# Patient Record
Sex: Female | Born: 1955 | Race: White | Hispanic: No | Marital: Single | Smoking: Current every day smoker
Health system: Southern US, Community
[De-identification: ages and names within clinical notes are randomized; demographics above are authoritative.]

## PROBLEM LIST (undated history)

## (undated) DIAGNOSIS — N6009 Solitary cyst of unspecified breast: Secondary | ICD-10-CM

## (undated) DIAGNOSIS — K219 Gastro-esophageal reflux disease without esophagitis: Secondary | ICD-10-CM

## (undated) HISTORY — PX: BUNIONECTOMY: SHX129

## (undated) HISTORY — DX: Solitary cyst of unspecified breast: N60.09

## (undated) HISTORY — PX: APPENDECTOMY: SHX54

## (undated) HISTORY — DX: Gastro-esophageal reflux disease without esophagitis: K21.9

## (undated) HISTORY — PX: LAPAROSCOPY: SHX197

---

## 2006-09-11 ENCOUNTER — Ambulatory Visit: Payer: Self-pay | Admitting: Obstetrics & Gynecology

## 2006-09-11 ENCOUNTER — Encounter: Payer: Self-pay | Admitting: Obstetrics & Gynecology

## 2007-05-05 ENCOUNTER — Ambulatory Visit: Payer: Self-pay | Admitting: Obstetrics & Gynecology

## 2007-05-26 ENCOUNTER — Ambulatory Visit: Payer: Self-pay | Admitting: *Deleted

## 2008-08-17 ENCOUNTER — Ambulatory Visit: Payer: Self-pay | Admitting: Obstetrics & Gynecology

## 2008-08-17 ENCOUNTER — Encounter: Payer: Self-pay | Admitting: Obstetrics & Gynecology

## 2008-08-17 LAB — CONVERTED CEMR LAB
ALT: 24 units/L (ref 0–35)
Alkaline Phosphatase: 66 units/L (ref 39–117)
CO2: 23 meq/L (ref 19–32)
Cholesterol: 157 mg/dL (ref 0–200)
Creatinine, Ser: 0.7 mg/dL (ref 0.40–1.20)
Glucose, Bld: 94 mg/dL (ref 70–99)
HCT: 41.7 % (ref 36.0–46.0)
LDL Cholesterol: 76 mg/dL (ref 0–99)
MCHC: 33.1 g/dL (ref 30.0–36.0)
MCV: 94.8 fL (ref 78.0–100.0)
Platelets: 290 10*3/uL (ref 150–400)
Sodium: 138 meq/L (ref 135–145)
Total Bilirubin: 0.4 mg/dL (ref 0.3–1.2)
Total CHOL/HDL Ratio: 2.7
Total Protein: 6.7 g/dL (ref 6.0–8.3)
Triglycerides: 113 mg/dL (ref <150)
VLDL: 23 mg/dL (ref 0–40)
WBC: 7.3 10*3/uL (ref 4.0–10.5)

## 2009-09-21 ENCOUNTER — Ambulatory Visit: Payer: Self-pay | Admitting: Obstetrics & Gynecology

## 2011-01-09 NOTE — Assessment & Plan Note (Signed)
NAME:  Daisy Garcia, Daisy Garcia              ACCOUNT NO.:  [id number]   MEDICAL RECORD NO.:  [id number]          PATIENT TYPE:  POB   LOCATION:  [facility] [place]         FACILITY:  [facility]   PHYSICIAN:  Ugonna Duru, MD        DATE OF BIRTH:  04/16/1956   DATE OF SERVICE:                                  CLINIC NOTE   The patient comes to the office today for her yearly pelvic exam.  The  patient did have her last Pap smear, which was normal.  She also had a  mammogram last year which was normal and an ultrasound for some right  lower quadrant pain and that was also normal.  She had a DEXA scan done  as well which did show osteoporosis.  She is currently on Fosamax 35 mg  1 per week and has done well with that.  She is also exercising  regularly with a personal trainer.  She also has a history of psoriasis.  She did see a dermatologist for that and she is planning to return to  see that dermatologist.  Since her last office visit, she has not been  smoking.  She is currently smoking 1 package of cigarettes per day.  We  did discuss this.  She declines Chantix.  She is here for the side  effects of Chantix.  Her weight is also up today from last year.  She  was 163 and today she is 171.  She again does exercise and she feels  that she eats well on her cholesterol food.  She did start her menopause  in 2004.  She is not using any hormone replacement at this time.  She is  again for a full side effects.  She is currently not sexually active.   PAST MEDICAL HISTORY:  Endometriosis, osteoporosis, and menopause.   REVIEW OF SYSTEMS:  The patient complains of headache, vaginal dryness,  and psoriasis.   SOCIAL HISTORY:  The patient smokes one pack of cigarettes per day and  drinks alcohol socially.   ALLERGIES:  No known drug allergies.   PHYSICAL EXAMINATION:  GENERAL:  Well-developed and well-nourished 52-  year-old Caucasian female in no acute distress.  VITAL SIGNS:  Blood pressure is  95/69, pulse 82, weight 171, and height  is 5 feet 6.  HEENT:  Head is normocephalic and atraumatic.  CARDIAC:  Regular rate and rhythm.  LUNGS:  Clear bilaterally without rales, rhonchi, or wheezes.  BREASTS:  Breasts were without any mass or nipple discharge.  ABDOMEN:  Soft and round and with no mass.  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  Vaginal area is moderately atrophic.  Cervix is without lesions.  Uterus, normal size and shape.  Adnexa, not enlarged and nontender.   ASSESSMENT AND PLAN:  Annual gynecologic exam.  Pap smear was obtained.  The patient was quite uncomfortable and hopefully we did get all the  cells needed.  The patient is scheduled for mammogram as well as TSH,  CBC, Chem-20, and lipid panel.  She will also be scheduled for a  mammogram.  She will continue the  Fosamax 35   mg 1 every week.  She was given a prescription for 1 year.  The patient was asked to return to the office as needed.  She was also  asked to discontinue smoking.      Linda Rosenbloom, NP    ______________________________  Ugonna Duru, MD    LR/MEDQ  D:  08/17/2008  T:  08/18/2008  Job:  [id number] 

## 2011-01-09 NOTE — Assessment & Plan Note (Signed)
NAMELEATHER, ESTIS              ACCOUNT NO.:  [id number]   MEDICAL RECORD NO.:  [id number]          PATIENT TYPE:  POB   LOCATION:  [facility] at [facility]         FACILITY:  [facility]   PHYSICIAN:  Allie Bossier, MD        DATE OF BIRTH:  05-10-1956   DATE OF SERVICE:  09/21/2009                                  CLINIC NOTE   Ms. Hirsch is a 55 year old single white, gravida 0, who comes in here  for annual exam.  She has no particular complaints today.  She is  interested in smoking cessation but does not want Chantix.   PAST MEDICAL HISTORY:  Osteoporosis diagnosed on DEXA scan 2008, and she  is a smoker.   REVIEW OF SYSTEMS:  She is abstinent.  She works at [employer] in [employer].   PAST SURGICAL HISTORY:  She has had bunion/neuromas of her left foot.  She has had laparoscopy done for endometriosis in the distant past.   ALLERGIES:  No known drug allergies.  No LATEX allergies.   MEDICATIONS:  She takes alendronate 35 mg weekly, flaxseed oil,  glucosamine, vitamin C, vitamin B6, B12, and potassium.  She also takes  evening primrose oil.   SOCIAL HISTORY:  She smokes for decades.   FAMILY HISTORY:  Osteoporosis in her mother and grandmother.  Breast  cancer in her mother.  She denies family history of colon and GYN  cancer, but coronary artery disease is present in her family.   PHYSICAL EXAMINATION:  VITAL SIGNS:  Weight 174, height 5 feet 6 inches,  blood pressure 115/75, pulse 82.  HEENT:  Normal.  BREASTS:  Normal bilaterally.  ABDOMEN:  Benign.  She has a scar in her umbilicus from her previous  laparoscopy.  No palpable hepatosplenomegaly.  EXTERNAL GENITALIA:  Severe atrophy.  Cervix atrophic, nulliparous.  Vaginal mucosa has no  abnormal discharge.  Uterus is very small, anteverted, mobile.  Her  adnexa are nonpalpable.  There are no adnexal masses.   ASSESSMENT AND PLAN:  Annual exam.  I have checked Pap smear.  Recommended self-breast and  self-vulvar exams.  With regard to her  genital atrophy, I have told her that if she plans to be sexually  active, to call me and I will call in Vagifem for at least a month prior  to the onset of sexual activity.  With regards to her smoking cessation,  she  is willing to try to cut back/quit with the aid of Wellbutrin.  I have  written her prescription for 150 mg b.i.d.  I told her to wait until she  has taken the medicine for a week before she tries to cut back on her  cigarettes.      Allie Bossier, MD     MCD/MEDQ  D:  09/21/2009  T:  09/22/2009  Job:  [id number]

## 2011-01-09 NOTE — Assessment & Plan Note (Signed)
[name], Daisy Garcia              ACCOUNT NO.:  [id number]   MEDICAL RECORD NO.:  [id number]          PATIENT TYPE:  POB   LOCATION:  [facility]         FACILITY:  [facility]   PHYSICIAN:  Johnella Moloney, MD        DATE OF BIRTH:  05/06/1956   DATE OF SERVICE:                                  CLINIC NOTE   The patient comes to the office today for her yearly pelvic exam.  The  patient did have her last Pap smear, which was normal.  She also had a  mammogram last year which was normal and an ultrasound for some right  lower quadrant pain and that was also normal.  She had a DEXA scan done  as well which did show osteoporosis.  She is currently on Fosamax 35 mg  1 per week and has done well with that.  She is also exercising  regularly with a Systems analyst.  She also has a history of psoriasis.  She did see a dermatologist for that and she is planning to return to  see that dermatologist.  Since her last office visit, she has not been  smoking.  She is currently smoking 1 package of cigarettes per day.  We  did discuss this.  She declines Chantix.  She is here for the side  effects of Chantix.  Her weight is also up today from last year.  She  was 163 and today she is 171.  She again does exercise and she feels  that she eats well on her cholesterol food.  She did start her menopause  in 2004.  She is not using any hormone replacement at this time.  She is  again for a full side effects.  She is currently not sexually active.   PAST MEDICAL HISTORY:  Endometriosis, osteoporosis, and menopause.   REVIEW OF SYSTEMS:  The patient complains of headache, vaginal dryness,  and psoriasis.   SOCIAL HISTORY:  The patient smokes one pack of cigarettes per day and  drinks alcohol socially.   ALLERGIES:  No known drug allergies.   PHYSICAL EXAMINATION:  GENERAL:  Well-developed and well-nourished 55-  year-old Caucasian female in no acute distress.  VITAL SIGNS:  Blood pressure is  95/69, pulse 82, weight 171, and height  is 5 feet 6.  HEENT:  Head is normocephalic and atraumatic.  CARDIAC:  Regular rate and rhythm.  LUNGS:  Clear bilaterally without rales, rhonchi, or wheezes.  BREASTS:  Breasts were without any mass or nipple discharge.  ABDOMEN:  Soft and round and with no mass.  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  Vaginal area is moderately atrophic.  Cervix is without lesions.  Uterus, normal size and shape.  Adnexa, not enlarged and nontender.   ASSESSMENT AND PLAN:  Annual gynecologic exam.  Pap smear was obtained.  The patient was quite uncomfortable and hopefully we did get all the  cells needed.  The patient is scheduled for mammogram as well as TSH,  CBC, Chem-20, and lipid panel.  She will also be scheduled for a  mammogram.  She will continue the  Fosamax 35  mg 1 every week.  She was given a prescription for 1 year.  The patient was asked to return to the office as needed.  She was also  asked to discontinue smoking.      Remonia Richter, NP    ______________________________  Johnella Moloney, MD    LR/MEDQ  D:  08/17/2008  T:  08/18/2008  Job:  [id number]

## 2011-01-12 NOTE — Assessment & Plan Note (Signed)
Daisy Garcia, MONTROSE              ACCOUNT NO.:  [id number]   MEDICAL RECORD NO.:  [id number]          PATIENT TYPE:  POB   LOCATION:  [facility]         FACILITY:  [facility]   PHYSICIAN:  Allie Bossier, MD        DATE OF BIRTH:  09/22/1955   DATE OF SERVICE:                                  CLINIC NOTE   CLINIC NOTE   Ms. Holts is a 55 year old single, white, gravida 0 who comes in for  annual exam for Pap smear.  Her last Pap smear and mammogram were done  in 2005.  Her last Pap, mammogram and DEXA scan were done in 2005.  She  comes in complaining of 3-4 month history of nagging right lower  quadrant pain.  She uses ibuprofen on occasion but as the pain is  intermittent, she is not sure how much help this is giving.  She also  wishes to have a refill on her Fosamax.   PAST MEDICAL HISTORY:  Endometriosis, osteoporosis and left sciatica.   REVIEW OF SYMPTOMS:  She sees a chiropractor in __________ for her  sciatica.  She reports only very mild distress incontinence GSUI.  She  denies bowel changes.  She is sexually active on occasion and uses  lubricants as necessary.   FAMILY HISTORY:  Significant for osteoporosis in her mother and  grandmother, breast cancer in her mother.  She denies a family history  of colon and GYN cancers and coronary artery disease is present in her  family.   SOCIAL HISTORY:  She smokes a pack a day.  Social alcohol.   PAST SURGICAL HISTORY:  Left neuroma and bunionectomies and a  laparoscopy for endometriosis.   ALLERGIES:  NO KNOWN DRUG ALLERGIES.   PHYSICAL EXAMINATION:  VITAL SIGNS:  Weight 179, blood pressure 96/67.  HEENT:  Normal.  HEART:  Regular rate and rhythm.  BREAST EXAM:  Normal.  ABDOMEN:  Normal.  There are no palpable organs.  EXTERNAL GENITALIA:  She has hidradenitis of her mons and groin.  Vagina  has moderate-to-severe atrophy.  Cervix is nulliparous, no lesions.  Uterus is normal size and shape, retroverted, mobile,  nontender.  Adnexa  are not enlarged and nontender.   ASSESSMENT/PLAN:  Annual exam.  I have obtained the Pap smears.  A  mammogram will be scheduled.  With regard to her osteoporosis, a DEXA  scan will be scheduled.  She will continue Fosamax, although I changed  the prescription to Fosamax with Vitamin D.  With regard to her  intermittent right lower quadrant pain, I ordered a  GYN ultrasound.  If this is normal, I have recommended a colonoscopy.  In any event, a colonoscopy is recommended when she turned 51 this year.  I also recommend she discontinue smoking.      Allie Bossier, MD     MCD/MEDQ  D:  09/11/2006  T:  09/11/2006  Job:  [id number]

## 2011-08-23 ENCOUNTER — Ambulatory Visit: Payer: Self-pay | Admitting: Obstetrics & Gynecology

## 2011-09-19 ENCOUNTER — Ambulatory Visit (INDEPENDENT_AMBULATORY_CARE_PROVIDER_SITE_OTHER): Payer: Self-pay | Admitting: Obstetrics & Gynecology

## 2011-09-19 ENCOUNTER — Encounter: Payer: Self-pay | Admitting: Obstetrics & Gynecology

## 2011-09-19 VITALS — BP 114/79 | HR 79 | Ht 66.0 in | Wt 174.0 lb

## 2011-09-19 DIAGNOSIS — Z0000 Encounter for general adult medical examination without abnormal findings: Secondary | ICD-10-CM

## 2011-09-19 DIAGNOSIS — Z1272 Encounter for screening for malignant neoplasm of vagina: Secondary | ICD-10-CM

## 2011-09-19 LAB — CBC
MCH: 31.9 pg (ref 26.0–34.0)
MCHC: 33.2 g/dL (ref 30.0–36.0)
MCV: 96.1 fL (ref 78.0–100.0)
Platelets: 283 10*3/uL (ref 150–400)
RBC: 4.33 MIL/uL (ref 3.87–5.11)
RDW: 13.2 % (ref 11.5–15.5)

## 2011-09-19 NOTE — Progress Notes (Signed)
Subjective:    Daisy Garcia is a 56 y.o. female who presents for annual exam. The patient has no complaints today. The patient is not currently sexually active. GYN screening history: last pap: was normal. The patient is not taking hormone replacement therapy. Patient denies post-menopausal vaginal bleeding.. The patient wears seatbelts: yes. The patient participates in regular exercise: no. Has the patient ever been transfused or tattooed?: no. The patient reports that there is not domestic violence in her life.   Menstrual History: OB History    Grav Para Term Preterm Abortions TAB SAB Ect Mult Living   0               Menarche age: 25 No LMP recorded. Patient is postmenopausal.    The following portions of the patient's history were reviewed and updated as appropriate: allergies, current medications, past family history, past medical history, past social history, past surgical history and problem list.  Review of Systems A comprehensive review of systems was negative.    Objective:    BP 114/79  Pulse 79  Ht 5\' 6"  (1.676 m)  Wt 174 lb (78.926 kg)  BMI 28.08 kg/m2  General Appearance:    Alert, cooperative, no distress, appears stated age  Head:    Normocephalic, without obvious abnormality, atraumatic  Eyes:    PERRL, conjunctiva/corneas clear, EOM's intact, fundi    benign, both eyes  Ears:    Normal TM's and external ear canals, both ears  Nose:   Nares normal, septum midline, mucosa normal, no drainage    or sinus tenderness  Throat:   Lips, mucosa, and tongue normal; teeth and gums normal  Neck:   Supple, symmetrical, trachea midline, no adenopathy;    thyroid:  no enlargement/tenderness/nodules; no carotid   bruit or JVD  Back:     Symmetric, no curvature, ROM normal, no CVA tenderness  Lungs:     Clear to auscultation bilaterally, respirations unlabored  Chest Wall:    No tenderness or deformity   Heart:    Regular rate and rhythm, S1 and S2 normal, no murmur, rub    or gallop  Breast Exam:    No tenderness, masses, or nipple abnormality  Abdomen:     Soft, non-tender, bowel sounds active all four quadrants,    no masses, no organomegaly  Genitalia:    Normal female without lesion, discharge or tenderness, severe atrophy, uterus NSSRV, NT, no adnexal masses     Extremities:   Extremities normal, atraumatic, no cyanosis or edema  Pulses:   2+ and symmetric all extremities  Skin:   Skin color, texture, turgor normal, no rashes or lesions  Lymph nodes:   Cervical, supraclavicular, and axillary nodes normal  Neurologic:   CNII-XII intact, normal strength, sensation and reflexes    throughout      Assessment:    Normal gyn exam    Plan:    Mammogram. Pap smear.  Routine lab work GI referral for colon cancer screening.

## 2011-09-20 LAB — HIV ANTIBODY (ROUTINE TESTING W REFLEX): HIV: NONREACTIVE

## 2011-09-25 ENCOUNTER — Ambulatory Visit (HOSPITAL_COMMUNITY): Payer: Self-pay

## 2011-09-26 LAB — LIPID PANEL
HDL: 41 mg/dL (ref >39)
LDL Cholesterol: 99 mg/dL (ref 0–99)
Triglycerides: 95 mg/dL (ref <150)
VLDL: 19 mg/dL (ref 0–40)

## 2011-09-26 LAB — COMPREHENSIVE METABOLIC PANEL
ALT: 27 U/L (ref 0–35)
AST: 27 U/L (ref 0–37)
Albumin: 4.3 g/dL (ref 3.5–5.2)
Alkaline Phosphatase: 68 U/L (ref 39–117)
Calcium: 9.9 mg/dL (ref 8.4–10.5)
Chloride: 104 mEq/L (ref 96–112)
Potassium: 5 mEq/L (ref 3.5–5.3)

## 2011-10-02 ENCOUNTER — Ambulatory Visit: Payer: Self-pay | Admitting: Obstetrics & Gynecology

## 2011-10-04 ENCOUNTER — Ambulatory Visit: Payer: Self-pay | Admitting: Gastroenterology

## 2011-10-16 ENCOUNTER — Telehealth: Payer: Self-pay | Admitting: *Deleted

## 2011-10-16 NOTE — Telephone Encounter (Signed)
Normal test results given to patient.

## 2012-01-22 ENCOUNTER — Inpatient Hospital Stay: Payer: Self-pay | Admitting: Surgery

## 2012-01-22 LAB — URINALYSIS, COMPLETE
Bacteria: NONE SEEN
Ketone: NEGATIVE
Nitrite: NEGATIVE
Ph: 7 (ref 4.5–8.0)
RBC,UR: 1 /HPF (ref 0–5)
Specific Gravity: 1.002 (ref 1.003–1.030)
Squamous Epithelial: <1
Transitional Epi: <1

## 2012-01-22 LAB — COMPREHENSIVE METABOLIC PANEL
Alkaline Phosphatase: 75 U/L (ref 50–136)
Anion Gap: 9 (ref 7–16)
BUN: 7 mg/dL (ref 7–18)
Bilirubin,Total: 0.8 mg/dL (ref 0.2–1.0)
Calcium, Total: 8.8 mg/dL (ref 8.5–10.1)
Chloride: 102 mmol/L (ref 98–107)
Co2: 25 mmol/L (ref 21–32)
Creatinine: 0.72 mg/dL (ref 0.60–1.30)
EGFR (African American): >60
EGFR (Non-African Amer.): >60
Glucose: 104 mg/dL — ABNORMAL HIGH (ref 65–99)
Osmolality: 270 (ref 275–301)
SGPT (ALT): 21 U/L
Sodium: 136 mmol/L (ref 136–145)
Total Protein: 7.2 g/dL (ref 6.4–8.2)

## 2012-01-22 LAB — LIPASE, BLOOD: Lipase: 77 U/L (ref 73–393)

## 2012-01-22 LAB — CBC: HGB: 13.7 g/dL (ref 12.0–16.0)

## 2012-01-24 LAB — CBC WITH DIFFERENTIAL/PLATELET
Basophil #: 0 10*3/uL (ref 0.0–0.1)
Eosinophil #: 0.1 10*3/uL (ref 0.0–0.7)
Eosinophil %: 1 %
HCT: 31.2 % — ABNORMAL LOW (ref 35.0–47.0)
HGB: 10.6 g/dL — ABNORMAL LOW (ref 12.0–16.0)
Lymphocyte #: 2.4 10*3/uL (ref 1.0–3.6)
Lymphocyte %: 30.3 %
MCH: 32.5 pg (ref 26.0–34.0)
MCHC: 33.8 g/dL (ref 32.0–36.0)
MCV: 96 fL (ref 80–100)
Monocyte #: 0.6 x10 3/mm (ref 0.2–0.9)
Monocyte %: 7.1 %
Neutrophil %: 61.3 %
RBC: 3.25 10*6/uL — ABNORMAL LOW (ref 3.80–5.20)
RDW: 13.4 % (ref 11.5–14.5)
WBC: 8 10*3/uL (ref 3.6–11.0)

## 2012-02-06 ENCOUNTER — Ambulatory Visit: Payer: Self-pay | Admitting: Surgery

## 2012-02-06 LAB — CBC WITH DIFFERENTIAL/PLATELET
Basophil %: 0.9 %
Eosinophil #: 0.1 10*3/uL (ref 0.0–0.7)
HCT: 38.4 % (ref 35.0–47.0)
Lymphocyte %: 33.4 %
MCHC: 34.7 g/dL (ref 32.0–36.0)
Monocyte #: 0.6 x10 3/mm (ref 0.2–0.9)
Neutrophil #: 4.6 10*3/uL (ref 1.4–6.5)
Neutrophil %: 57.3 %
RBC: 4.05 10*6/uL (ref 3.80–5.20)
RDW: 12.7 % (ref 11.5–14.5)
WBC: 7.9 10*3/uL (ref 3.6–11.0)

## 2012-02-15 ENCOUNTER — Encounter: Payer: Self-pay | Admitting: Obstetrics & Gynecology

## 2012-07-08 ENCOUNTER — Ambulatory Visit: Payer: Self-pay | Admitting: Internal Medicine

## 2012-07-08 IMAGING — US ABDOMEN ULTRASOUND
1 series · 14 of 25 positions shown · non-contrast
Comparison: none

REASON FOR EXAM: RUQ pain
COMMENTS:

[Series 1: abdomen ultrasound · 0.31mm/px · 14 of 79 slices shown]
[im 1/79]
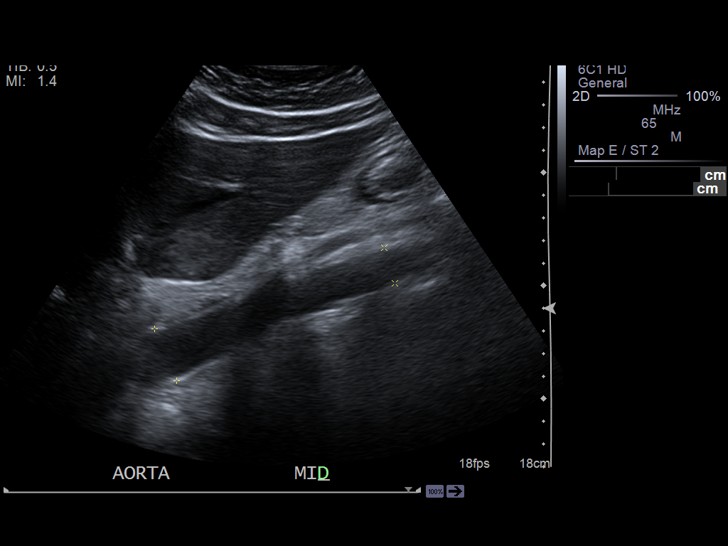
[im 7/79]
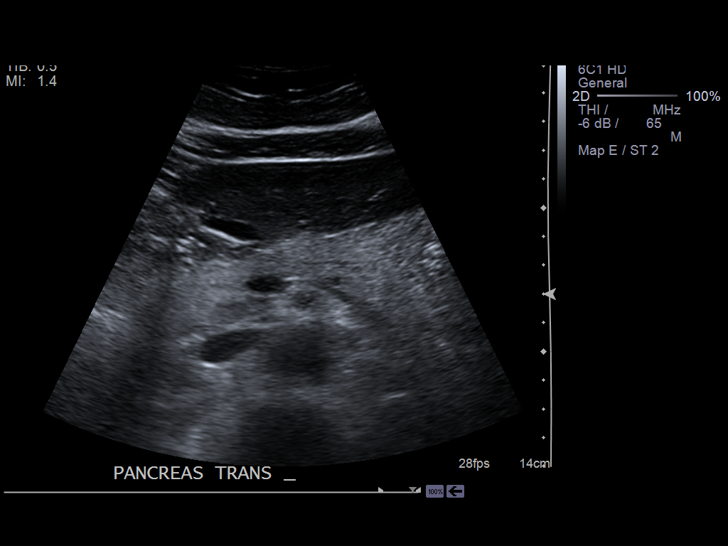
[im 14/79]
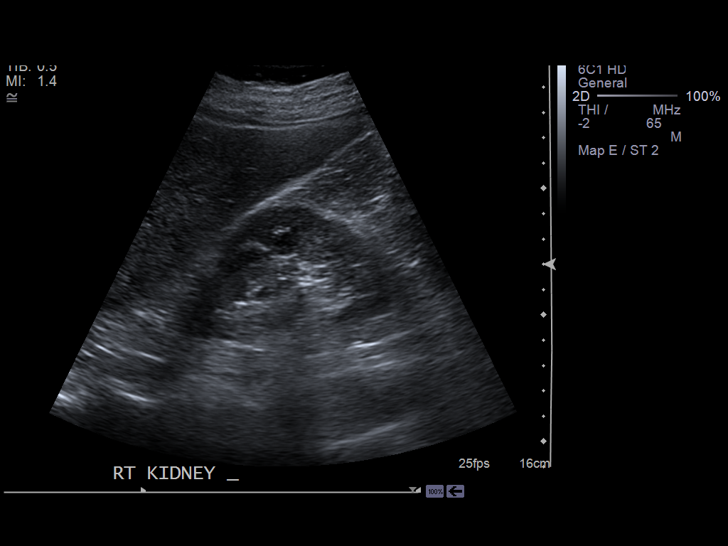
[im 20/79]
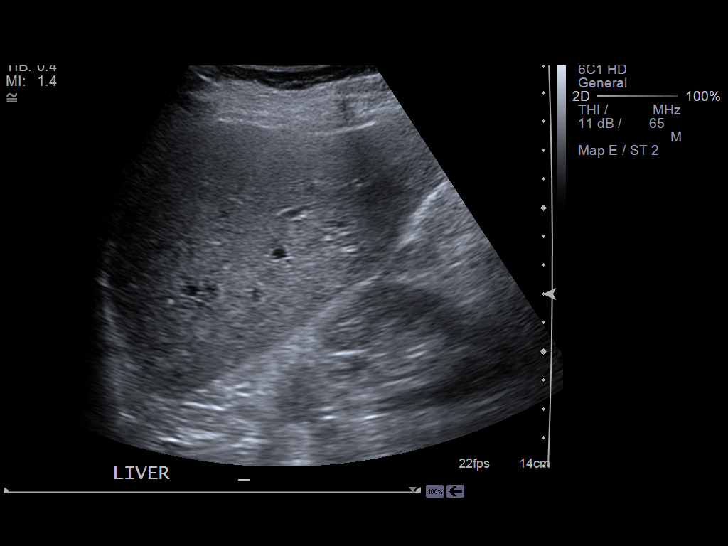
[im 27/79]
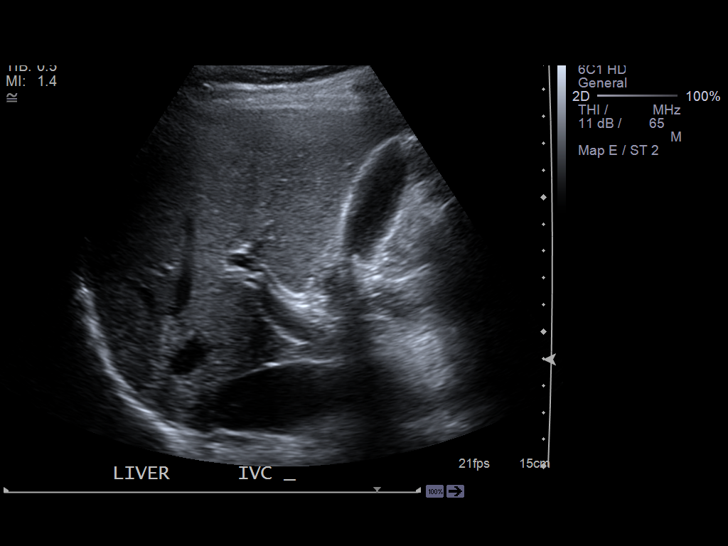
[im 30/79]
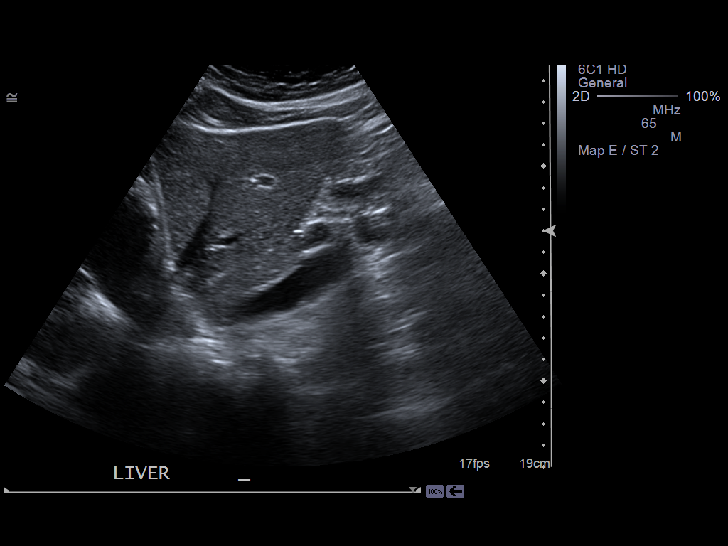
[im 36/79]
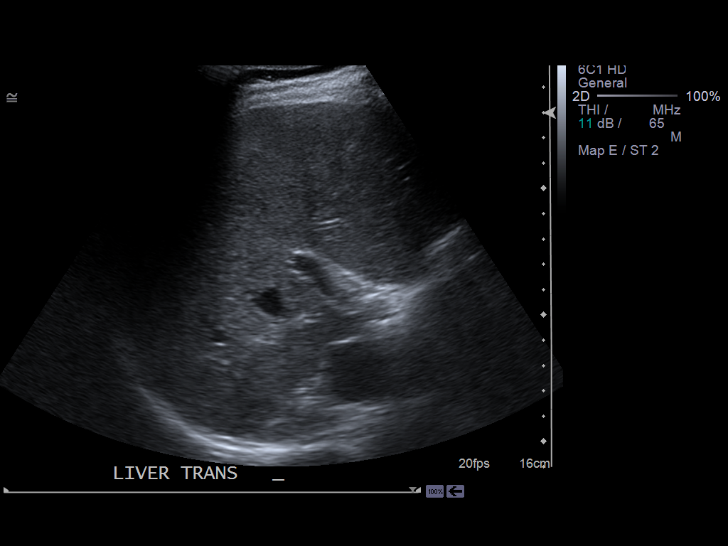
[im 43/79]
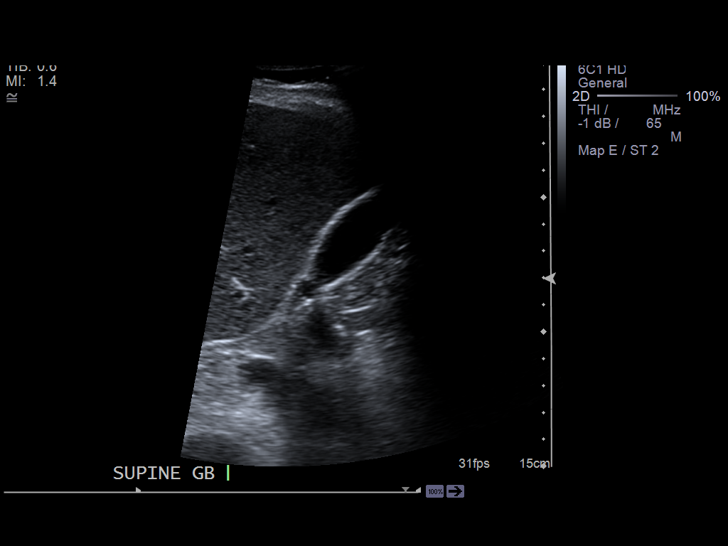
[im 49/79]
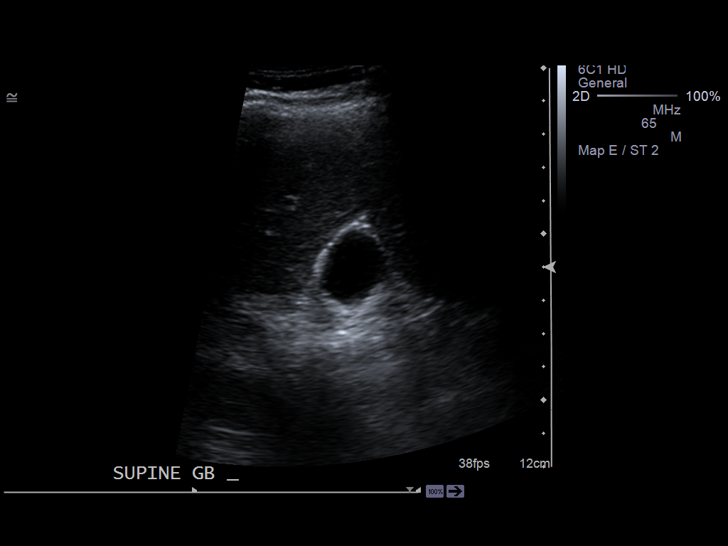
[im 53/79]
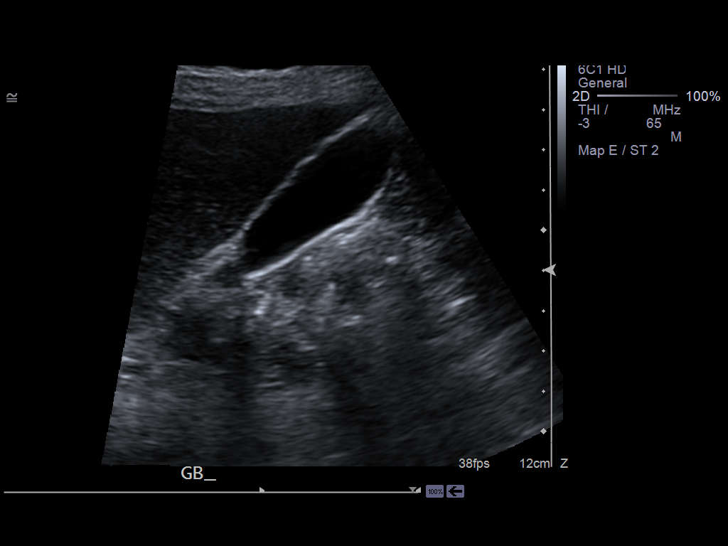
[im 59/79]
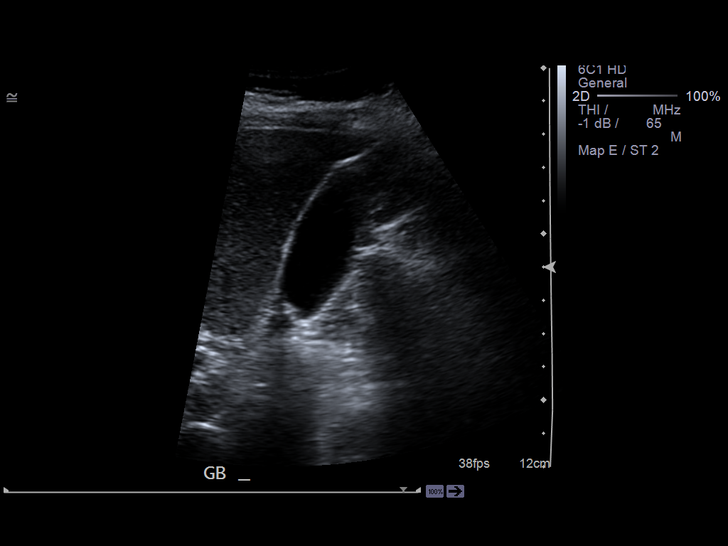
[im 66/79]
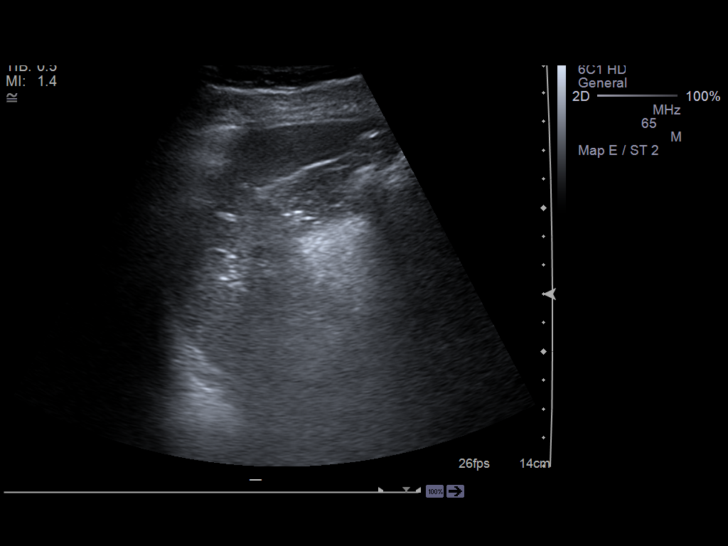
[im 72/79]
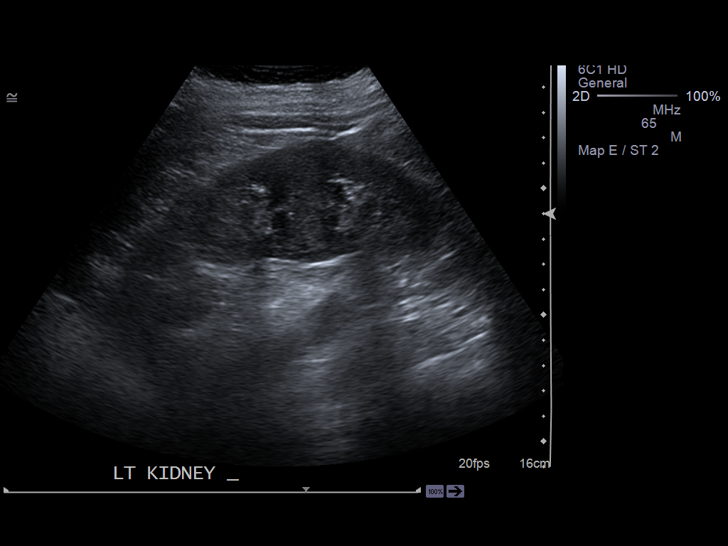
[im 79/79]
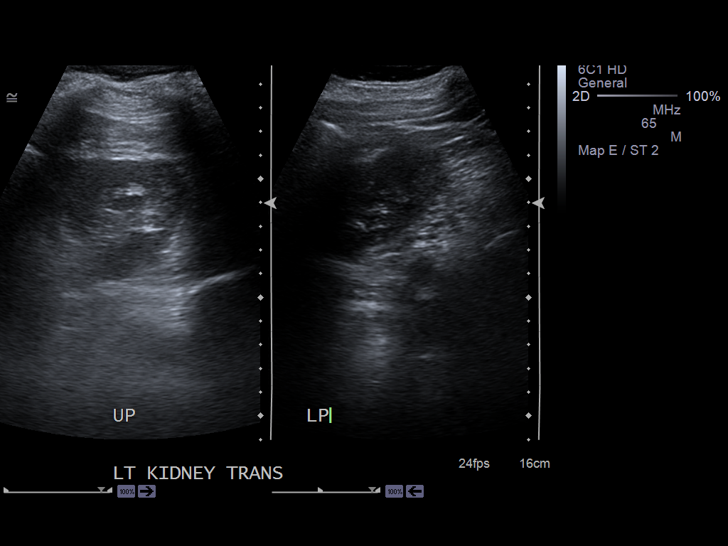

[14 of 25 positions shown; findings below may reference images not displayed]

PROCEDURE:     AGBONA - AGBONA ABDOMEN GENERAL SURVEY  - [DATE] [DATE]

RESULT:     The liver exhibits normal echotexture with no focal mass nor
ductal dilation. Portal venous flow is normal in direction toward the liver.
The gallbladder is adequately distended. There is an approximately 2 mm
diameter adherent nonshadowing focus that may reflect a tiny polyp versus
nonshadowing stone. There is no positive sonographic Murphy's sign. There is
no gallbladder wall thickening or pericholecystic fluid.

The common bile duct measures 3.3 mm in diameter. Evaluation of pancreas was
limited by bowel gas. The spleen, abdominal aorta, inferior vena cava, and
kidneys are normal in appearance. There is no evidence of ascites.
IMPRESSION: 1. There is a 2 mm diameter a hyperechoic focus in the gallbladder which may
reflect a nonshadowing stone or polyp.
2. Evaluation of the pancreas was limited.
3. The liver, spleen, and kidneys exhibit no acute abnormalities.

[REDACTED]

## 2012-07-15 ENCOUNTER — Ambulatory Visit: Payer: Self-pay | Admitting: Internal Medicine

## 2013-03-25 ENCOUNTER — Ambulatory Visit: Payer: Self-pay | Admitting: Family Medicine

## 2013-07-14 ENCOUNTER — Encounter: Payer: Self-pay | Admitting: Obstetrics & Gynecology

## 2013-07-14 ENCOUNTER — Ambulatory Visit (INDEPENDENT_AMBULATORY_CARE_PROVIDER_SITE_OTHER): Payer: Managed Care, Other (non HMO) | Admitting: Obstetrics & Gynecology

## 2013-07-14 VITALS — BP 114/86 | HR 84 | Ht 66.0 in | Wt 180.0 lb

## 2013-07-14 DIAGNOSIS — Z1151 Encounter for screening for human papillomavirus (HPV): Secondary | ICD-10-CM

## 2013-07-14 DIAGNOSIS — Z124 Encounter for screening for malignant neoplasm of cervix: Secondary | ICD-10-CM

## 2013-07-14 DIAGNOSIS — Z0000 Encounter for general adult medical examination without abnormal findings: Secondary | ICD-10-CM

## 2013-07-14 DIAGNOSIS — Z01419 Encounter for gynecological examination (general) (routine) without abnormal findings: Secondary | ICD-10-CM

## 2013-07-14 LAB — CBC
Hemoglobin: 14.1 g/dL (ref 12.0–15.0)
MCH: 31.8 pg (ref 26.0–34.0)
MCHC: 34.3 g/dL (ref 30.0–36.0)
Platelets: 295 10*3/uL (ref 150–400)
RBC: 4.44 MIL/uL (ref 3.87–5.11)

## 2013-07-14 LAB — COMPREHENSIVE METABOLIC PANEL
ALT: 21 U/L (ref 0–35)
Albumin: 4.3 g/dL (ref 3.5–5.2)
Alkaline Phosphatase: 63 U/L (ref 39–117)
Glucose, Bld: 97 mg/dL (ref 70–99)
Potassium: 4.3 mEq/L (ref 3.5–5.3)
Sodium: 137 mEq/L (ref 135–145)
Total Bilirubin: 0.5 mg/dL (ref 0.3–1.2)
Total Protein: 6.6 g/dL (ref 6.0–8.3)

## 2013-07-14 LAB — LIPID PANEL
Cholesterol: 160 mg/dL (ref 0–200)
HDL: 59 mg/dL (ref >39)
LDL Cholesterol: 77 mg/dL (ref 0–99)
Total CHOL/HDL Ratio: 2.7 Ratio

## 2013-07-14 LAB — TSH: TSH: 1.531 u[IU]/mL (ref 0.350–4.500)

## 2013-07-14 NOTE — Progress Notes (Signed)
Subjective:    Daisy Garcia is a 57 y.o. female who presents for an annual exam. The patient has no complaints today except mild UTI symptoms since last night. She would like fasting labs today.The patient is not currently sexually active. GYN screening history: last pap: was normal. The patient wears seatbelts: yes. The patient participates in regular exercise: yes. Has the patient ever been transfused or tattooed?: no. The patient reports that there is not domestic violence in her life.   Menstrual History: OB History   Grav Para Term Preterm Abortions TAB SAB Ect Mult Living   0               Menarche age: 46 Coitarche: 21  No LMP recorded. Patient is postmenopausal.    The following portions of the patient's history were reviewed and updated as appropriate: allergies, current medications, past family history, past medical history, past social history, past surgical history and problem list.  Review of Systems A comprehensive review of systems was negative. Considering colonoscopy. 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 She went through menopause at 57 yo and denies PMB.   Objective:    BP 114/86  Pulse 84  Ht 5\' 6"  (1.676 m)  Wt 180 lb (81.647 kg)  BMI 29.07 kg/m2  General Appearance:    Alert, cooperative, no distress, appears stated age  Head:    Normocephalic, without obvious abnormality, atraumatic  Eyes:    PERRL, conjunctiva/corneas clear, EOM's intact, fundi    benign, both eyes  Ears:    Normal TM's and external ear canals, both ears  Nose:   Nares normal, septum midline, mucosa normal, no drainage    or sinus tenderness  Throat:   Lips, mucosa, and tongue normal; teeth and gums normal  Neck:   Supple, symmetrical, trachea midline, no adenopathy;    thyroid:  no enlargement/tenderness/nodules; no carotid   bruit or JVD  Back:     Symmetric, no curvature, ROM normal, no CVA tenderness  Lungs:     Clear to auscultation bilaterally, respirations  unlabored  Chest Wall:    No tenderness or deformity   Heart:    Regular rate and rhythm, S1 and S2 normal, no murmur, rub   or gallop  Breast Exam:    No tenderness, masses, or nipple abnormality  Abdomen:     Soft, non-tender, bowel sounds active all four quadrants,    no masses, no organomegaly  Genitalia:    Normal female without lesion, discharge or tenderness, moderate atrophy, nulliparous cervix, NSS mid plane, mobile, NT, normal adnexal exam     Extremities:   Extremities normal, atraumatic, no cyanosis or edema  Pulses:   2+ and symmetric all extremities  Skin:   Skin color, texture, turgor normal, no rashes or lesions  Lymph nodes:   Cervical, supraclavicular, and axillary nodes normal  Neurologic:   CNII-XII intact, normal strength, sensation and reflexes    throughout  .    Assessment:    Healthy female exam.    Plan:     Breast self exam technique reviewed and patient encouraged to perform self-exam monthly. Mammogram. Thin prep Pap smear. with cotesting She declines a flu vaccine. Fasting labs and UA

## 2013-07-28 ENCOUNTER — Ambulatory Visit: Payer: Self-pay | Admitting: Obstetrics & Gynecology

## 2014-06-11 ENCOUNTER — Other Ambulatory Visit: Payer: Self-pay

## 2014-12-19 NOTE — Discharge Summary (Signed)
PATIENT NAME:  Daisy Garcia, [name] MR#:  [id number] DATE OF BIRTH:  [birth year]  DATE OF ADMISSION:  01/22/2012 DATE OF DISCHARGE:  01/24/2012  DISCHARGE DIAGNOSIS:  Acute gangrenous appendicitis.   PROCEDURE: Laparoscopic appendectomy.   HISTORY OF PRESENT ILLNESS/HOSPITAL COURSE: The patient was admitted to the hospital with findings of probable appendicitis. She was taken to the Operating Room where laparoscopic appendectomy was performed confirming the presence of gangrenous appendicitis. Postoperatively, she was kept on IV antibiotics. She is currently afebrile, tolerating a regular diet, with a normal white blood cell count.   DISCHARGE PLAN: She will be discharged with oral analgesics, on a regular diet, and with instructions to shower and follow up in my office in 10 days.   ____________________________ Adah Salvageichard E. Excell Seltzerooper, MD rec:cbb D: 01/24/2012 09:11:07 ET T: 01/24/2012 15:35:23 ET JOB#: [id number]  cc: Adah Salvageichard E. Excell Seltzerooper, MD, <Dictator> Lattie HawICHARD E Alivea Gladson MD ELECTRONICALLY SIGNED 01/24/2012 16:34

## 2014-12-19 NOTE — H&P (Signed)
PATIENT NAME:  Daisy Garcia, Daisy Garcia MR#:  [id number] DATE OF BIRTH:  [birth year]  DATE OF ADMISSION:  01/22/2012  CHIEF COMPLAINT: Right lower quadrant pain.   HISTORY OF PRESENT ILLNESS: This is a patient with about 24 to 30 hours of abdominal pain centered in the right lower quadrant. She has had similar episodes that she attributed to endometriosis in the past, and, in fact, has been diagnosed with that via laparoscopy in the distant past. She denies emesis but has been nauseated. She ate breakfast this morning at about 8:30. The patient has experienced no dysuria, no fevers or chills, and started having diarrhea this morning.   PAST MEDICAL HISTORY: She denies any past medical history.   PAST SURGICAL HISTORY: She has had foot surgery and a laparoscopy for endometriosis.   ALLERGIES: None.   MEDICATIONS: None.   FAMILY HISTORY: Noncontributory.   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 She smokes tobacco. She does not drink alcohol.   REVIEW OF SYSTEMS: A 10-system review is performed and negative with the exception of that mentioned in the history of present illness.   PHYSICAL EXAMINATION:  GENERAL: Healthy, comfortable-appearing female patient.   VITAL SIGNS: Temperature 98.7, pulse 91, respirations 18, blood pressure 135/60. Pain scale of 7. A BMI of 28.   HEENT: No scleral icterus.   NECK: No palpable neck nodes.   CHEST: Clear to auscultation.   CARDIAC: Regular rate and rhythm.   ABDOMEN: Soft. There is a small umbilical hernia which is reducible and nontender. She is tender in the right lower quadrant and suprapubic area with percussion tenderness. Minimal guarding and a positive Rovsing's sign.   EXTREMITIES: Extremities show minimal edema.   NEUROLOGIC: Grossly intact.   INTEGUMENT: No jaundice:   LABORATORY, DIAGNOSTIC AND RADIOLOGICAL DATA:  CT scan is personally reviewed showing appendicitis.  Electrolytes are  within normal limits.  White blood cell count is 13.5.  Urinalysis is showing minimal blood.  ASSESSMENT AND PLAN: This is a patient with probable acute appendicitis. I have recommended diagnostic laparoscopy. She has a small umbilical hernia which will probably not be repaired at this time due to the risk of infection and the potential for mesh placement, etc. It will not be addressed. The rationale for this was discussed with the patient. The risks of bleeding, infection, negative laparoscopy, conversion to an open procedure, and bowel injury were reviewed with her as well. She understood and agreed to proceed.   ____________________________ Adah Salvageichard E. Excell Seltzerooper, MD rec:cbb D: 01/22/2012 13:12:51 ET T: 01/22/2012 13:24:47 ET JOB#: [id number]  cc: Adah Salvageichard E. Excell Seltzerooper, MD, <Dictator> Lattie HawICHARD E COOPER MD ELECTRONICALLY SIGNED 01/22/2012 15:30

## 2014-12-19 NOTE — H&P (Signed)
Subjective/Chief Complaint RLQ pain    History of Present Illness 24 hrs abd pain, rlq nausea, anorexia. no emesis no f/c prioe pain from endometriosis    Past History laparascopy for endometriosis PMH none   Past Med/Surgical Hx:  Endometriosis:   Foot Surgery - Left:   ALLERGIES:  No Known Allergies:   Family and Social History:   Family History Non-Contributory    Social History positive  tobacco, negative ETOH, Kidde    + Tobacco Current (within 1 year)    Place of Living Home   Review of Systems:   Fever/Chills No    Cough No    Abdominal Pain Yes    Diarrhea Yes    Constipation No    Nausea/Vomiting Yes    SOB/DOE No    Chest Pain No    Dysuria No    Tolerating Diet Yes  Nauseated   Physical Exam:   GEN no acute distress    HEENT pink conjunctivae    NECK supple    RESP normal resp effort  clear BS    CARD regular rate    ABD positive tenderness  soft  max in RLQ, pos Rosving's sign    LYMPH negative neck    EXTR positive edema    SKIN normal to palpation    PSYCH alert, A+O to time, place, person, good insight   Routine UA:  28-May-13 09:59    Clarity (UA) Clear   Glucose (UA) Negative   Bilirubin (UA) Negative   Ketones (UA) Negative   Specific Gravity (UA) 1.002   Blood (UA) 1+   pH (UA) 7.0   Protein (UA) Negative   Nitrite (UA) Negative   Leukocyte Esterase (UA) Negative   RBC (UA) 1 /HPF   WBC (UA) <1 /HPF   Epithelial Cells (UA) <1 /HPF   Transitional Epithelial (UA) <1 /HPF  Routine Hem:  28-May-13 10:42    WBC (CBC) 13.5   RBC (CBC) 4.29   Hemoglobin (CBC) 13.7   Hematocrit (CBC) 41.0   Platelet Count (CBC) 229   MCV 96   MCH 32.0   MCHC 33.5   RDW 13.5  Routine Chem:  28-May-13 10:42    Lipase 77   Glucose, Serum 104   BUN 7   Creatinine (comp) 0.72   Sodium, Serum 136   Potassium, Serum 3.8   Chloride, Serum 102   CO2, Serum 25   Calcium (Total), Serum 8.8  Hepatic:  28-May-13 10:42     Bilirubin, Total 0.8   Alkaline Phosphatase 75   SGPT (ALT) 21   SGOT (AST) 16   Total Protein, Serum 7.2   Albumin, Serum 3.8  Routine Chem:  28-May-13 10:42    Osmolality (calc) 270   eGFR (African American) >60   eGFR (Non-African American) >60   Anion Gap 9   Radiology Results: CT:    28-May-13 12:36, CT Abdomen and Pelvis With Contrast   CT Abdomen and Pelvis With Contrast   REASON FOR EXAM:    (1) RLQ pain; (2) RLQ pain  COMMENTS:   May transport without cardiac monitor    PROCEDURE: CT  - CT ABDOMEN / PELVIS  W  - Jan 22 2012 12:36PM     RESULT: Comparison:  None    Technique: Multiple axial images of the abdomen and pelvis were performed   from the lung bases to the pubic symphysis, with p.o. contrast and with   85 mL of Isovue  300 intravenous contrast.    Findings:  The liver, gallbladder, spleen, adrenals, and pancreas are unremarkable.   The kidneys enhance normally. There are a few 3 mm calculi in the right   kidney versus vascular calcifications in the renal hilum. No     hydronephrosis.    The small and large bowel are normal in caliber. Mild apparent thickening   of the wall of the ascending colon and hepatic flexure is felt to be   secondary to underdistention. The appendix is dilated and fluid-filled,   measuring 11 mm in diameter. There is adjacent inflammatory stranding.   The medial wall of the base of the appendix is not well visualized,   raising the possibility of rupture. No discrete adjacent fluid collection   seen.    No aggressive lytic or sclerotic osseous lesions are identified.    IMPRESSION:   Acute appendicitis. The medial wall of the base of the appendix is not   well visualized, raising the possibility of rupture, although this is not     definitive. No discreet periappendiceal fluid collection seen.    This was called to Dr. Lenise Arena and 1245 hours 01/22/2012          Verified By: Gregor Hams, M.D., MD      Assessment/Admission Diagnosis CT rev'd plan lap appy, risks, options in detail see dictation   Electronic Signatures: Florene Glen (MD)  (Signed 28-May-13 13:09)  Authored: CHIEF COMPLAINT and HISTORY, PAST MEDICAL/SURGIAL HISTORY, ALLERGIES, FAMILY AND SOCIAL HISTORY, REVIEW OF SYSTEMS, PHYSICAL EXAM, LABS, Radiology, ASSESSMENT AND PLAN   Last Updated: 28-May-13 13:09 by Florene Glen (MD)

## 2014-12-19 NOTE — Op Note (Signed)
PATIENT NAME:  Daisy Garcia, Daisy Garcia MR#:  [id number] DATE OF BIRTH:  Jan 19, 1956  DATE OF PROCEDURE:  01/22/2012  PREOPERATIVE DIAGNOSIS: Acute appendicitis.   POSTOPERATIVE DIAGNOSIS: Acute gangrenous appendicitis with rupture.   PROCEDURE: Laparoscopic appendectomy.   SURGEON: Dionne Miloichard Aeisha Minarik, MD   ANESTHESIA: General with endotracheal tube.   INDICATIONS: This is a patient with progressive right lower quadrant pain and tenderness with signs of peritoneal irritation and work-up showing CT suggestive of appendicitis and leukocytosis preoperatively. We discussed the rationale for surgery, the option of observation, risks of bleeding, infection, recurrence, negative laparoscopy, and an open procedure. This was all reviewed for her in the preop holding area. She understood and agreed to proceed.   FINDINGS: Acute appendicitis with a tiny area of gangrene near the base of the appendix with purulence exuding suggesting rupture.   DESCRIPTION OF PROCEDURE: The patient was induced to general anesthesia, given IV antibiotics. VTE prophylaxis was in place. She was prepped and draped in a sterile fashion. Marcaine was infiltrated in the skin and subcutaneous tissues around the periumbilical area. Incision was made. A Veress needle was placed. Pneumoperitoneum was obtained, and a 5 mm trocar port was placed. The abdominal cavity was explored, and under direct vision a 5 mm suprapubic port and a left lateral 12 mm port was placed. Beneath the omentum, which was encasing the appendix, was found the appendix. It was elevated, and in taking the omentum off an area measuring 2 or 3 mm was found to be gangrenous with a hole exuding pus from this into this encasement cavity basically formed by the omentum. Dissection around this area demonstrated gangrene at the base of the appendix. The base of the appendix was divided with an EndoGIA, and then the mesoappendix was handled with a vascular load EndoGIA, and the specimen  was passed out through the lateral port site with the aid of an EndoCatch bag. Photos had been taken as well. The area was checked for hemostasis, irrigated with copious amounts of normal saline. There was no sign of bleeding or bowel injury. Therefore, after irrigating and aspirating with copious amounts of normal saline, the left lateral port site was closed under direct vision utilizing an Endoclose technique with 0 Vicryl interrupted sutures, and then pneumoperitoneum was released. All ports were removed, and 4-0 subcuticular Monocryl was used at all skin edges. Steri-Strips, Mastisol, and sterile dressings were placed.   The patient tolerated the procedure well. There were no complications. She was taken to the recovery room in stable condition to be admitted for continued care.  ____________________________ Adah Salvageichard E. Excell Seltzerooper, MD rec:cbb D: 01/22/2012 15:04:37 ET T: 01/22/2012 16:21:55 ET JOB#: [id number]  cc: Adah Salvageichard E. Excell Seltzerooper, MD, <Dictator> Lattie HawICHARD E Aqua Denslow MD ELECTRONICALLY SIGNED 01/23/2012 8:49

## 2015-07-29 ENCOUNTER — Ambulatory Visit (INDEPENDENT_AMBULATORY_CARE_PROVIDER_SITE_OTHER): Payer: Managed Care, Other (non HMO) | Admitting: Obstetrics & Gynecology

## 2015-07-29 ENCOUNTER — Encounter: Payer: Self-pay | Admitting: Obstetrics & Gynecology

## 2015-07-29 VITALS — BP 127/84 | HR 73 | Ht 66.0 in | Wt 166.0 lb

## 2015-07-29 DIAGNOSIS — Z0000 Encounter for general adult medical examination without abnormal findings: Secondary | ICD-10-CM

## 2015-07-29 DIAGNOSIS — Z1151 Encounter for screening for human papillomavirus (HPV): Secondary | ICD-10-CM | POA: Diagnosis not present

## 2015-07-29 DIAGNOSIS — Z01419 Encounter for gynecological examination (general) (routine) without abnormal findings: Secondary | ICD-10-CM | POA: Diagnosis not present

## 2015-07-29 DIAGNOSIS — Z124 Encounter for screening for malignant neoplasm of cervix: Secondary | ICD-10-CM | POA: Diagnosis not present

## 2015-07-29 LAB — CBC
HCT: 38.6 % (ref 36.0–46.0)
Hemoglobin: 13.4 g/dL (ref 12.0–15.0)
MCH: 32.4 pg (ref 26.0–34.0)
MCHC: 34.7 g/dL (ref 30.0–36.0)
MCV: 93.5 fL (ref 78.0–100.0)
MPV: 10.1 fL (ref 8.6–12.4)
Platelets: 297 10*3/uL (ref 150–400)
RBC: 4.13 MIL/uL (ref 3.87–5.11)
RDW: 13.3 % (ref 11.5–15.5)
WBC: 7.5 10*3/uL (ref 4.0–10.5)

## 2015-07-29 LAB — COMPREHENSIVE METABOLIC PANEL
ALT: 18 U/L (ref 6–29)
AST: 20 U/L (ref 10–35)
Albumin: 4.4 g/dL (ref 3.6–5.1)
Alkaline Phosphatase: 62 U/L (ref 33–130)
BILIRUBIN TOTAL: 0.5 mg/dL (ref 0.2–1.2)
BUN: 12 mg/dL (ref 7–25)
CO2: 25 mmol/L (ref 20–31)
CREATININE: 0.58 mg/dL (ref 0.50–1.05)
Calcium: 9.8 mg/dL (ref 8.6–10.4)
Chloride: 99 mmol/L (ref 98–110)
Glucose, Bld: 95 mg/dL (ref 65–99)
POTASSIUM: 4.4 mmol/L (ref 3.5–5.3)
Sodium: 134 mmol/L — ABNORMAL LOW (ref 135–146)
TOTAL PROTEIN: 6.7 g/dL (ref 6.1–8.1)

## 2015-07-29 LAB — LIPID PANEL
Cholesterol: 154 mg/dL (ref 125–200)
HDL: 61 mg/dL (ref >=46)
LDL Cholesterol: 80 mg/dL (ref <130)
Total CHOL/HDL Ratio: 2.5 Ratio (ref <=5.0)
Triglycerides: 67 mg/dL (ref <150)
VLDL: 13 mg/dL (ref <30)

## 2015-07-29 NOTE — Progress Notes (Signed)
Subjective:    Daisy Garcia is a 59 y.o. SWP0 female who presents for an annual exam. The patient has no complaints today. The patient is not currently sexually active. GYN screening history: last pap: was normal. The patient wears seatbelts: yes. The patient participates in regular exercise: yes. Has the patient ever been transfused or tattooed?: no. The patient reports that there is not domestic violence in her life.   Menstrual History: OB History    Gravida Para Term Preterm AB TAB SAB Ectopic Multiple Living   0               Menarche age: [age]  No LMP recorded. Patient is postmenopausal.    The following portions of the patient's history were reviewed and updated as appropriate: allergies, current medications, past family history, past medical history, past social history, past surgical history and problem list.  Review of Systems Pertinent items noted in HPI and remainder of comprehensive ROS otherwise negative. Declines flu vaccine. No colonoscopy   Objective:    BP 127/84 mmHg  Pulse 73  Ht 5\' 6"  (1.676 m)  Wt 166 lb (75.297 kg)  BMI 26.81 kg/m2  General Appearance:    Alert, cooperative, no distress, appears stated age  Head:    Normocephalic, without obvious abnormality, atraumatic  Eyes:    PERRL, conjunctiva/corneas clear, EOM's intact, fundi    benign, both eyes  Ears:    Normal TM's and external ear canals, both ears  Nose:   Nares normal, septum midline, mucosa normal, no drainage    or sinus tenderness  Throat:   Lips, mucosa, and tongue normal; teeth and gums normal  Neck:   Supple, symmetrical, trachea midline, no adenopathy;    thyroid:  no enlargement/tenderness/nodules; no carotid   bruit or JVD  Back:     Symmetric, no curvature, ROM normal, no CVA tenderness  Lungs:     Clear to auscultation bilaterally, respirations unlabored  Chest Wall:    No tenderness or deformity   Heart:    Regular rate and rhythm, S1 and S2 normal, no murmur, rub   or gallop   Breast Exam:    No tenderness, masses, or nipple abnormality  Abdomen:     Soft, non-tender, bowel sounds active all four quadrants,    no masses, no organomegaly  Genitalia:    Normal female without lesion, discharge or tenderness, NSSA, NT, mobile, normal adnexal exam     Extremities:   Extremities normal, atraumatic, no cyanosis or edema  Pulses:   2+ and symmetric all extremities  Skin:   Skin color, texture, turgor normal, no rashes or lesions  Lymph nodes:   Cervical, supraclavicular, and axillary nodes normal  Neurologic:   CNII-XII intact, normal strength, sensation and reflexes    throughout  .    Assessment:    Healthy female exam.    Plan:     Breast self exam technique reviewed and patient encouraged to perform self-exam monthly. fasting labs   Refer gi

## 2015-07-30 LAB — VITAMIN D 25 HYDROXY (VIT D DEFICIENCY, FRACTURES): VIT D 25 HYDROXY: 34 ng/mL (ref 30–100)

## 2015-07-30 LAB — TSH: TSH: 1.352 u[IU]/mL (ref 0.350–4.500)

## 2015-07-30 LAB — HEPATITIS C ANTIBODY: HCV Ab: NEGATIVE

## 2015-08-02 LAB — CYTOLOGY - PAP

## 2015-08-17 ENCOUNTER — Encounter: Payer: Self-pay | Admitting: *Deleted

## 2016-12-19 DIAGNOSIS — M1711 Unilateral primary osteoarthritis, right knee: Secondary | ICD-10-CM | POA: Insufficient documentation

## 2020-07-12 ENCOUNTER — Encounter: Payer: Self-pay | Admitting: Family Medicine

## 2020-07-12 ENCOUNTER — Other Ambulatory Visit (HOSPITAL_COMMUNITY)
Admission: RE | Admit: 2020-07-12 | Discharge: 2020-07-12 | Disposition: A | Discharge status: Home / Self Care | Payer: Managed Care, Other (non HMO) | Source: Ambulatory Visit | Attending: Family Medicine | Admitting: Family Medicine

## 2020-07-12 ENCOUNTER — Other Ambulatory Visit: Payer: Self-pay

## 2020-07-12 ENCOUNTER — Ambulatory Visit (INDEPENDENT_AMBULATORY_CARE_PROVIDER_SITE_OTHER): Payer: Managed Care, Other (non HMO) | Admitting: Family Medicine

## 2020-07-12 DIAGNOSIS — Z124 Encounter for screening for malignant neoplasm of cervix: Secondary | ICD-10-CM | POA: Diagnosis present

## 2020-07-12 DIAGNOSIS — Z01419 Encounter for gynecological examination (general) (routine) without abnormal findings: Secondary | ICD-10-CM | POA: Diagnosis not present

## 2020-07-12 NOTE — Progress Notes (Signed)
  Subjective:     Daisy Garcia is a 64 y.o. female and is here for a comprehensive physical exam. The patient reports no problems. Menopausal since age 67.  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  She is not sexually active. Takes vitamins only.  The following portions of the patient's history were reviewed and updated as appropriate: allergies, current medications, past family history, past medical history, past social history, past surgical history and problem list.  Review of Systems Pertinent items noted in HPI and remainder of comprehensive ROS otherwise negative.   Objective:    BP 123/82   Pulse 69   Ht 5\' 5"  (1.651 m)   Wt 166 lb 6.4 oz (75.5 kg)   BMI 27.69 kg/m  General appearance: alert, cooperative and appears stated age Head: Normocephalic, without obvious abnormality, atraumatic Neck: no adenopathy, supple, symmetrical, trachea midline and thyroid not enlarged, symmetric, no tenderness/mass/nodules Lungs: clear to auscultation bilaterally Breasts: normal appearance, no masses or tenderness Heart: regular rate and rhythm, S1, S2 normal, no murmur, click, rub or gallop Abdomen: soft, non-tender; bowel sounds normal; no masses,  no organomegaly Pelvic: cervix normal in appearance, external genitalia normal, no adnexal masses or tenderness, no cervical motion tenderness, uterus normal size, shape, and consistency and vaginal atrophy Extremities: Homans sign is negative, no sign of DVT Pulses: 2+ and symmetric Skin: Skin color, texture, turgor normal. No rashes or lesions Lymph nodes: Cervical, supraclavicular, and axillary nodes normal. Neurologic: Grossly normal    Assessment:     GYN female exam.      Plan:  Declined flu, TDaP (considering), has had ColoGard last December. Will not need another pap/pelvic unless has sx's.   Screening for malignant neoplasm of cervix - Plan: Cytology - PAP  Encounter for  gynecological examination without abnormal finding - Plan: MM 3D SCREEN BREAST BILATERAL  Return in 1 year (on 07/12/2021).  See After Visit Summary for Counseling Recommendations

## 2020-07-12 NOTE — Patient Instructions (Signed)

## 2020-07-15 LAB — CYTOLOGY - PAP
Adequacy: ABSENT
Comment: NEGATIVE
Diagnosis: NEGATIVE
High risk HPV: NEGATIVE

## 2021-06-09 ENCOUNTER — Encounter: Payer: Self-pay | Admitting: Radiology

## 2021-08-23 ENCOUNTER — Ambulatory Visit: Payer: Managed Care, Other (non HMO) | Admitting: Family Medicine

## 2021-09-20 ENCOUNTER — Encounter: Payer: Self-pay | Admitting: Family Medicine

## 2021-09-20 ENCOUNTER — Ambulatory Visit (INDEPENDENT_AMBULATORY_CARE_PROVIDER_SITE_OTHER): Payer: Managed Care, Other (non HMO) | Admitting: Family Medicine

## 2021-09-20 ENCOUNTER — Other Ambulatory Visit: Payer: Self-pay

## 2021-09-20 VITALS — BP 120/76 | HR 79 | Ht 65.0 in | Wt 170.0 lb

## 2021-09-20 DIAGNOSIS — Z01419 Encounter for gynecological examination (general) (routine) without abnormal findings: Secondary | ICD-10-CM

## 2021-09-20 DIAGNOSIS — Z800 Family history of malignant neoplasm of digestive organs: Secondary | ICD-10-CM | POA: Diagnosis not present

## 2021-09-20 DIAGNOSIS — R29890 Loss of height: Secondary | ICD-10-CM | POA: Diagnosis not present

## 2021-09-20 DIAGNOSIS — Z1231 Encounter for screening mammogram for malignant neoplasm of breast: Secondary | ICD-10-CM | POA: Diagnosis not present

## 2021-09-20 NOTE — Assessment & Plan Note (Signed)
Declines colonoscopy. To message me closer to 12/23 for repeat Cologuard.

## 2021-09-20 NOTE — Addendum Note (Signed)
Addended by: Scheryl Marten on: 09/20/2021 09:01 AM   Modules accepted: Orders

## 2021-09-20 NOTE — Progress Notes (Signed)
Subjective:     Daisy Garcia is a 66 y.o. female and is here for a comprehensive physical exam. The patient reports problems - RLQ pain. Has remote h/o endometriosis and previous appy. Pain resolves on it's own. Occurs monthly, not associated with anything. Has been menopausal x 20+ years. Has f/h osteoporosis. On vitamin D. New FH of stomach cancer in sister - deceased. Father passed from colon cancer. Last Cologuard 07/2019.   The following portions of the patient's history were reviewed and updated as appropriate: allergies, current medications, past family history, past medical history, past social history, past surgical history, and problem list.  Review of Systems Pertinent items noted in HPI and remainder of comprehensive ROS otherwise negative.   Objective:    BP 120/76    Pulse 79    Ht 5\' 5"  (1.651 m)    Wt 170 lb (77.1 kg)    BMI 28.29 kg/m  General appearance: alert, cooperative, and appears stated age Head: Normocephalic, without obvious abnormality, atraumatic Neck: no adenopathy, supple, symmetrical, trachea midline, and thyroid not enlarged, symmetric, no tenderness/mass/nodules Lungs: clear to auscultation bilaterally Breasts: normal appearance, no masses or tenderness Heart: regular rate and rhythm, S1, S2 normal, no murmur, click, rub or gallop Abdomen: soft, non-tender; bowel sounds normal; no masses,  no organomegaly Extremities: extremities normal, atraumatic, no cyanosis or edema Pulses: 2+ and symmetric Skin: Skin color, texture, turgor normal. No rashes or lesions Lymph nodes: Cervical, supraclavicular, and axillary nodes normal. Neurologic: Grossly normal    Assessment:    GYN female exam.      Plan:   Problem List Items Addressed This Visit       Unprioritized   Family history of colon cancer in father    Declines colonoscopy. To message me closer to 12/23 for repeat Cologuard.      Other Visit Diagnoses     Encounter for gynecological  examination without abnormal finding    -  Primary   Relevant Orders   CBC   TSH   Comprehensive metabolic panel   Hemoglobin A1c   Lipid panel   Encounter for screening mammogram for malignant neoplasm of breast       Relevant Orders   MM DIGITAL SCREENING BILATERAL   Symptom related to loss of height       Relevant Orders   DG Bone Density      Return in 1 year (on 09/20/2022).    See After Visit Summary for Counseling Recommendations

## 2021-09-21 LAB — COMPREHENSIVE METABOLIC PANEL
ALT: 16 IU/L (ref 0–32)
AST: 21 IU/L (ref 0–40)
Albumin/Globulin Ratio: 1.8 (ref 1.2–2.2)
Albumin: 4.6 g/dL (ref 3.8–4.8)
Alkaline Phosphatase: 102 IU/L (ref 44–121)
BUN/Creatinine Ratio: 15 (ref 12–28)
BUN: 12 mg/dL (ref 8–27)
Bilirubin Total: 0.4 mg/dL (ref 0.0–1.2)
CO2: 23 mmol/L (ref 20–29)
Calcium: 10.3 mg/dL (ref 8.7–10.3)
Chloride: 98 mmol/L (ref 96–106)
Creatinine, Ser: 0.78 mg/dL (ref 0.57–1.00)
Globulin, Total: 2.5 g/dL (ref 1.5–4.5)
Glucose: 98 mg/dL (ref 70–99)
Potassium: 4.8 mmol/L (ref 3.5–5.2)
Sodium: 136 mmol/L (ref 134–144)
Total Protein: 7.1 g/dL (ref 6.0–8.5)
eGFR: 85 mL/min/{1.73_m2} (ref >59)

## 2021-09-21 LAB — HEMOGLOBIN A1C
Est. average glucose Bld gHb Est-mCnc: 117 mg/dL
Hgb A1c MFr Bld: 5.7 % — ABNORMAL HIGH (ref 4.8–5.6)

## 2021-09-21 LAB — LIPID PANEL
Chol/HDL Ratio: 3.3 ratio (ref 0.0–4.4)
Cholesterol, Total: 205 mg/dL — ABNORMAL HIGH (ref 100–199)
HDL: 62 mg/dL (ref >39)
LDL Chol Calc (NIH): 124 mg/dL — ABNORMAL HIGH (ref 0–99)
Triglycerides: 108 mg/dL (ref 0–149)
VLDL Cholesterol Cal: 19 mg/dL (ref 5–40)

## 2021-09-21 LAB — CBC
Hematocrit: 43.7 % (ref 34.0–46.6)
Hemoglobin: 14.8 g/dL (ref 11.1–15.9)
MCH: 31.8 pg (ref 26.6–33.0)
MCHC: 33.9 g/dL (ref 31.5–35.7)
MCV: 94 fL (ref 79–97)
Platelets: 323 10*3/uL (ref 150–450)
RBC: 4.66 x10E6/uL (ref 3.77–5.28)
RDW: 12.1 % (ref 11.7–15.4)
WBC: 9.7 10*3/uL (ref 3.4–10.8)

## 2021-09-21 LAB — TSH: TSH: 1.38 u[IU]/mL (ref 0.450–4.500)

## 2021-09-29 ENCOUNTER — Other Ambulatory Visit: Payer: Self-pay | Admitting: *Deleted

## 2021-09-29 DIAGNOSIS — Z1231 Encounter for screening mammogram for malignant neoplasm of breast: Secondary | ICD-10-CM

## 2021-10-04 ENCOUNTER — Other Ambulatory Visit: Payer: Self-pay | Admitting: *Deleted

## 2021-10-04 ENCOUNTER — Telehealth: Payer: Self-pay

## 2021-10-04 DIAGNOSIS — Z1211 Encounter for screening for malignant neoplasm of colon: Secondary | ICD-10-CM

## 2021-10-04 DIAGNOSIS — Z800 Family history of malignant neoplasm of digestive organs: Secondary | ICD-10-CM

## 2021-10-04 NOTE — Telephone Encounter (Signed)
Patient is calling today requesting order to be sent in for the Cologuard. please advise. Patient requested a call to be notified when this is done. Thank you!

## 2021-10-30 ENCOUNTER — Encounter: Payer: Self-pay | Admitting: Family Medicine

## 2022-08-09 ENCOUNTER — Telehealth: Payer: Self-pay | Admitting: Family Medicine

## 2022-08-09 DIAGNOSIS — R195 Other fecal abnormalities: Secondary | ICD-10-CM

## 2022-08-09 LAB — COLOGUARD: COLOGUARD: POSITIVE — AB

## 2022-08-10 NOTE — Telephone Encounter (Signed)
Discussed positive cologuard testing--will refer for colonoscopy.

## 2022-08-16 ENCOUNTER — Other Ambulatory Visit: Payer: Self-pay | Admitting: *Deleted

## 2022-08-16 ENCOUNTER — Telehealth: Payer: Self-pay | Admitting: *Deleted

## 2022-08-16 DIAGNOSIS — R195 Other fecal abnormalities: Secondary | ICD-10-CM

## 2022-08-16 MED ORDER — NA SULFATE-K SULFATE-MG SULF 17.5-3.13-1.6 GM/177ML PO SOLN: 1.0000 | Freq: Once | ORAL | 0 refills | Status: AC

## 2022-08-16 NOTE — Telephone Encounter (Signed)
Gastroenterology Pre-Procedure Review  Request Date: 10/02/2022 Requesting Physician: Dr. Tobi Bastos  PATIENT REVIEW QUESTIONS: The patient responded to the following health history questions as indicated:    1. Are you having any GI issues? no 2. Do you have a personal history of Polyps? no 3. Do you have a family history of Colon Cancer or Polyps? yes (father had colon cancer and sisters had polyps) 4. Diabetes Mellitus? no 5. Joint replacements in the past 12 months?no 6. Major health problems in the past 3 months?no 7. Any artificial heart valves, MVP, or defibrillator?no    MEDICATIONS & ALLERGIES:    Patient reports the following regarding taking any anticoagulation/antiplatelet therapy:   Plavix, Coumadin, Eliquis, Xarelto, Lovenox, Pradaxa, Brilinta, or Effient? no Aspirin? no  Patient confirms/reports the following medications:  Current Outpatient Medications  Medication Sig Dispense Refill   Calcium Carb-Cholecalciferol (SM CALCIUM 500/VITAMIN D3) 500-10 MG-MCG TABS Take by mouth.     Cyanocobalamin (VITAMIN B-12 CR PO) Take by mouth.     Evening Primrose topical oil Apply topically as needed.     fluticasone (FLONASE) 50 MCG/ACT nasal spray      Glucosamine-Chondroitin (GLUCOSAMINE CHONDR COMPLEX PO) Take by mouth.     Pyridoxine HCl (VITAMIN B-6 PO) Take by mouth. (Patient not taking: Reported on 09/20/2021)     VITAMIN C, CALCIUM ASCORBATE, PO Take by mouth.     No current facility-administered medications for this visit.    Patient confirms/reports the following allergies:  No Known Allergies  No orders of the defined types were placed in this encounter.   AUTHORIZATION INFORMATION Primary Insurance: 1D#: Group #:  Secondary Insurance: 1D#: Group #:  SCHEDULE INFORMATION: Date: 10/02/2022 Time: Location: [facility]

## 2022-09-10 ENCOUNTER — Telehealth: Payer: Self-pay | Admitting: *Deleted

## 2022-09-10 NOTE — Telephone Encounter (Signed)
So patient was contact by Leander Rams at [facility] office regarding new insurance card was needed for the colonoscopy that was schedule on 10/02/2022. Patient inform Lattie Haw that patient called last week to the [place] office to cancel the colonoscopy. Patient stated that she spoken to [place] to cancel the colonoscopy. I have apologize to patient that request was not relay to the scheduler to cancel the the colonoscopy that is why I had left a message for patient to return my call.  Patient did not want to give me a reason for the cancellation. Just that she have to get things in order before she proceeded.  I have called [place] [facility] to cancel as requested.

## 2022-10-02 ENCOUNTER — Ambulatory Visit: Admit: 2022-10-02 | Payer: Medicare HMO | Admitting: Gastroenterology

## 2022-10-02 SURGERY — COLONOSCOPY WITH PROPOFOL
Anesthesia: General

## 2024-01-24 ENCOUNTER — Other Ambulatory Visit: Payer: Self-pay | Admitting: Family Medicine

## 2024-01-24 DIAGNOSIS — Z0000 Encounter for general adult medical examination without abnormal findings: Secondary | ICD-10-CM

## 2024-01-24 DIAGNOSIS — Z87891 Personal history of nicotine dependence: Secondary | ICD-10-CM
